# Patient Record
Sex: Male | Born: 1995 | Race: Black or African American | Hispanic: No | Marital: Single | Smoking: Never smoker
Health system: Southern US, Community
[De-identification: ages and names within clinical notes are randomized; demographics above are authoritative.]

---

## 2017-10-01 ENCOUNTER — Other Ambulatory Visit: Payer: Self-pay

## 2017-10-01 ENCOUNTER — Encounter: Payer: Self-pay | Admitting: *Deleted

## 2017-10-01 ENCOUNTER — Emergency Department
Admission: EM | Admit: 2017-10-01 | Discharge: 2017-10-01 | Disposition: A | Discharge status: Home / Self Care | Payer: Medicaid Other | Attending: Emergency Medicine | Admitting: Emergency Medicine

## 2017-10-01 DIAGNOSIS — R112 Nausea with vomiting, unspecified: Secondary | ICD-10-CM | POA: Diagnosis present

## 2017-10-01 DIAGNOSIS — K529 Noninfective gastroenteritis and colitis, unspecified: Secondary | ICD-10-CM | POA: Diagnosis not present

## 2017-10-01 LAB — COMPREHENSIVE METABOLIC PANEL
ALK PHOS: 61 U/L (ref 38–126)
ALT: 26 U/L (ref 17–63)
ANION GAP: 8 (ref 5–15)
AST: 27 U/L (ref 15–41)
Albumin: 4 g/dL (ref 3.5–5.0)
BUN: 15 mg/dL (ref 6–20)
CALCIUM: 9.2 mg/dL (ref 8.9–10.3)
CO2: 23 mmol/L (ref 22–32)
CREATININE: 0.96 mg/dL (ref 0.61–1.24)
Chloride: 106 mmol/L (ref 101–111)
GFR calc non Af Amer: >60 mL/min (ref >60)
Glucose, Bld: 125 mg/dL — ABNORMAL HIGH (ref 65–99)
Potassium: 4 mmol/L (ref 3.5–5.1)
SODIUM: 137 mmol/L (ref 135–145)
Total Bilirubin: 1.2 mg/dL (ref 0.3–1.2)
Total Protein: 7.6 g/dL (ref 6.5–8.1)

## 2017-10-01 LAB — URINALYSIS, COMPLETE (UACMP) WITH MICROSCOPIC
Bacteria, UA: NONE SEEN
Bilirubin Urine: NEGATIVE
GLUCOSE, UA: NEGATIVE mg/dL
HGB URINE DIPSTICK: NEGATIVE
KETONES UR: NEGATIVE mg/dL
Leukocytes, UA: NEGATIVE
Nitrite: NEGATIVE
PH: 7 (ref 5.0–8.0)
PROTEIN: NEGATIVE mg/dL
Specific Gravity, Urine: 1.023 (ref 1.005–1.030)
Squamous Epithelial / LPF: NONE SEEN

## 2017-10-01 LAB — LIPASE, BLOOD: Lipase: 23 U/L (ref 11–51)

## 2017-10-01 LAB — CBC
HCT: 45.1 % (ref 40.0–52.0)
HEMOGLOBIN: 15.5 g/dL (ref 13.0–18.0)
MCH: 29.9 pg (ref 26.0–34.0)
MCHC: 34.4 g/dL (ref 32.0–36.0)
MCV: 86.9 fL (ref 80.0–100.0)
PLATELETS: 282 10*3/uL (ref 150–440)
RBC: 5.19 MIL/uL (ref 4.40–5.90)
RDW: 16.7 % — ABNORMAL HIGH (ref 11.5–14.5)
WBC: 10.2 10*3/uL (ref 3.8–10.6)

## 2017-10-01 MED ORDER — LOPERAMIDE HCL 2 MG PO CAPS
ORAL_CAPSULE | ORAL | Status: AC
  Filled 2017-10-01: qty 2

## 2017-10-01 MED ORDER — ONDANSETRON 4 MG PO TBDP: 4.0000 mg | ORAL_TABLET | Freq: Three times a day (TID) | ORAL | 0 refills | Status: DC | PRN

## 2017-10-01 MED ORDER — ONDANSETRON 4 MG PO TBDP
4.0000 mg | ORAL_TABLET | Freq: Once | ORAL | Status: AC
  Administered 2017-10-01: 4 mg via ORAL

## 2017-10-01 MED ORDER — LOPERAMIDE HCL 2 MG PO TABS: 2.0000 mg | ORAL_TABLET | Freq: Four times a day (QID) | ORAL | 0 refills | Status: AC | PRN

## 2017-10-01 MED ORDER — ONDANSETRON 4 MG PO TBDP
ORAL_TABLET | ORAL | Status: AC
  Filled 2017-10-01: qty 1

## 2017-10-01 MED ORDER — LOPERAMIDE HCL 2 MG PO CAPS
4.0000 mg | ORAL_CAPSULE | Freq: Once | ORAL | Status: AC
  Administered 2017-10-01: 4 mg via ORAL

## 2017-10-01 NOTE — ED Notes (Signed)
Pt ambulatory to POV. VSS. NAD. Discharge instructions RX and follow up reviewed. All questions answered.

## 2017-10-01 NOTE — ED Provider Notes (Signed)
[facility] Emergency Department Provider Note  Time seen: 11:01 PM  I have reviewed the triage vital signs and the nursing notes.   HISTORY  Chief Complaint Abdominal Pain and Back Pain    HPI Jeffrey Moore is a 22 y.o. male with no significant past medical history presents to the emergency department for nausea vomiting diarrhea.  According to the patient since last night he is felt body aches and cramps, has been nauseated and developed watery diarrhea last night which has continued today along with frequent episodes of vomiting.  Patient denies any abdominal "pain."  But does state cramping before he has to have a bowel movement.  States body aches mild headache, denies any cough or congestion.   No past medical history on file.  There are no active problems to display for this patient.     Prior to Admission medications   Not on File    Allergies  Allergen Reactions  . Amoxil [Amoxicillin] Rash    No family history on file.  Social History Social History   Tobacco Use  . Smoking status: Never Smoker  . Smokeless tobacco: Never Used  Substance Use Topics  . Alcohol use: No    Frequency: Never  . Drug use: No    Review of Systems Constitutional: Negative for fever, low-grade fever 99.6 in the emergency department. Eyes: Negative for visual complaints ENT: Negative for recent illness/congestion Cardiovascular: Negative for chest pain. Respiratory: Negative for shortness of breath. Gastrointestinal: Positive for abdominal cramping.  Nausea vomiting diarrhea. Genitourinary: Negative for urinary compaints Musculoskeletal: States some body aches/cramps. Skin: Negative for skin complaints  Neurological: Mild headache All other ROS negative  ____________________________________________   PHYSICAL EXAM:  VITAL SIGNS: ED Triage Vitals  Enc Vitals Group     BP 10/01/17 2001 110/78     Pulse Rate 10/01/17 2001 (!) 115     Resp  10/01/17 2001 18     Temp 10/01/17 2001 99.6 F (37.6 C)     Temp Source 10/01/17 2001 Oral     SpO2 10/01/17 2001 98 %     Weight 10/01/17 2001 275 lb (124.7 kg)     Height 10/01/17 2001 6' (1.829 m)     Head Circumference --      Peak Flow --      Pain Score 10/01/17 2005 6     Pain Loc --      Pain Edu? --      Excl. in GC? --    Constitutional: Alert and oriented. Well appearing and in no distress. Eyes: Normal exam ENT   Head: Normocephalic and atraumatic.   Mouth/Throat: Mucous membranes are moist. Cardiovascular: Normal rate, regular rhythm. No murmur Respiratory: Normal respiratory effort without tachypnea nor retractions. Breath sounds are clear  Gastrointestinal: Soft, largely nontender to palpation.  No rebound or guarding.  Patient does state mild cramping sensations at times. Musculoskeletal: Nontender with normal range of motion in all extremities.  Neurologic:  Normal speech and language. No gross focal neurologic deficits Skin:  Skin is warm, dry and intact.  Psychiatric: Mood and affect are normal.    INITIAL IMPRESSION / ASSESSMENT AND PLAN / ED COURSE  Pertinent labs & imaging results that were available during my care of the patient were reviewed by me and considered in my medical decision making (see chart for details).  She presents the emergency department for nausea vomiting diarrhea beginning last night.  Symptoms are very suggestive of gastroenteritis, differential  would include gastroenteritis, gastritis, enteritis, viral illness, colitis or diverticulitis.  Patient has a nontender abdominal exam, labs are largely within normal limits.  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  Patient does have a low-grade fever 99.6 in the emergency department.  No significant dehydration on blood work.  Will dose Zofran, loperamide.  We will discharge on the same.   I discussed return precautions with the patient as well as supportive care at home, Tylenol, ibuprofen fluids and good handwashing.  ____________________________________________   FINAL CLINICAL IMPRESSION(S) / ED DIAGNOSES  Gastroenteritis    Minna Antis, MD 10/01/17 2304

## 2017-10-01 NOTE — ED Triage Notes (Signed)
Pt reports low back pain and abd pain.  Vomited x 3 .  Diarrhea x 1.  Sx for 1 day.

## 2018-06-07 ENCOUNTER — Other Ambulatory Visit: Payer: Self-pay

## 2018-06-07 ENCOUNTER — Emergency Department
Admission: EM | Admit: 2018-06-07 | Discharge: 2018-06-07 | Disposition: A | Discharge status: Home / Self Care | Payer: No Typology Code available for payment source | Attending: Emergency Medicine | Admitting: Emergency Medicine

## 2018-06-07 ENCOUNTER — Emergency Department: Payer: No Typology Code available for payment source

## 2018-06-07 DIAGNOSIS — K529 Noninfective gastroenteritis and colitis, unspecified: Secondary | ICD-10-CM | POA: Diagnosis not present

## 2018-06-07 DIAGNOSIS — R1013 Epigastric pain: Secondary | ICD-10-CM | POA: Diagnosis present

## 2018-06-07 LAB — COMPREHENSIVE METABOLIC PANEL
ALT: 24 U/L (ref 0–44)
AST: 28 U/L (ref 15–41)
Albumin: 4.1 g/dL (ref 3.5–5.0)
Alkaline Phosphatase: 58 U/L (ref 38–126)
Anion gap: 5 (ref 5–15)
BUN: 15 mg/dL (ref 6–20)
CALCIUM: 9.2 mg/dL (ref 8.9–10.3)
CHLORIDE: 112 mmol/L — AB (ref 98–111)
CO2: 24 mmol/L (ref 22–32)
CREATININE: 0.88 mg/dL (ref 0.61–1.24)
Glucose, Bld: 105 mg/dL — ABNORMAL HIGH (ref 70–99)
Potassium: 3.7 mmol/L (ref 3.5–5.1)
SODIUM: 141 mmol/L (ref 135–145)
Total Bilirubin: 1.1 mg/dL (ref 0.3–1.2)
Total Protein: 6.9 g/dL (ref 6.5–8.1)

## 2018-06-07 LAB — CBC
HEMATOCRIT: 41.2 % (ref 39.0–52.0)
Hemoglobin: 14.8 g/dL (ref 13.0–17.0)
MCH: 30.7 pg (ref 26.0–34.0)
MCHC: 35.9 g/dL (ref 30.0–36.0)
MCV: 85.5 fL (ref 80.0–100.0)
NRBC: 0 % (ref 0.0–0.2)
PLATELETS: 307 10*3/uL (ref 150–400)
RBC: 4.82 MIL/uL (ref 4.22–5.81)
RDW: 15.9 % — AB (ref 11.5–15.5)
WBC: 18.4 10*3/uL — AB (ref 4.0–10.5)

## 2018-06-07 LAB — INFLUENZA PANEL BY PCR (TYPE A & B)
INFLAPCR: NEGATIVE
INFLBPCR: NEGATIVE

## 2018-06-07 LAB — LIPASE, BLOOD: LIPASE: 28 U/L (ref 11–51)

## 2018-06-07 MED ORDER — ONDANSETRON HCL 4 MG/2ML IJ SOLN
4.0000 mg | Freq: Once | INTRAMUSCULAR | Status: AC
  Administered 2018-06-07: 4 mg via INTRAVENOUS
  Filled 2018-06-07: qty 2

## 2018-06-07 MED ORDER — SODIUM CHLORIDE 0.9 % IV BOLUS: 1000.0000 mL | Freq: Once | INTRAVENOUS | Status: DC

## 2018-06-07 MED ORDER — OXYCODONE HCL 5 MG PO TABS: 5.0000 mg | ORAL_TABLET | ORAL | 0 refills | Status: AC | PRN

## 2018-06-07 MED ORDER — FAMOTIDINE IN NACL 20-0.9 MG/50ML-% IV SOLN
20.0000 mg | Freq: Once | INTRAVENOUS | Status: AC
  Administered 2018-06-07: 20 mg via INTRAVENOUS
  Filled 2018-06-07: qty 50

## 2018-06-07 MED ORDER — ONDANSETRON 4 MG PO TBDP: 4.0000 mg | ORAL_TABLET | Freq: Three times a day (TID) | ORAL | 0 refills | Status: AC | PRN

## 2018-06-07 MED ORDER — HYDROMORPHONE HCL 1 MG/ML IJ SOLN
0.5000 mg | Freq: Once | INTRAMUSCULAR | Status: AC
  Administered 2018-06-07: 0.5 mg via INTRAVENOUS
  Filled 2018-06-07: qty 1

## 2018-06-07 MED ORDER — IOPAMIDOL (ISOVUE-300) INJECTION 61%
125.0000 mL | Freq: Once | INTRAVENOUS | Status: AC | PRN
  Administered 2018-06-07: 125 mL via INTRAVENOUS

## 2018-06-07 NOTE — ED Provider Notes (Signed)
[facility] Emergency Department Provider Note  ____________________________________________  Time seen: Approximately 3:33 PM  I have reviewed the triage vital signs and the nursing notes.   HISTORY  Chief Complaint Abdominal Pain    HPI Jeffrey Moore is a 22 y.o. male with morbid obesity, no history of abdominal surgery, presenting for epigastric pain, nausea and vomiting.  The patient reports that he was standing at the bank at 12 noon today when he had the acute onset of a severe epigastric pain with multiple episodes of nausea and vomiting, now unable to drink any fluids.  He had a normal bowel movement this morning, but since then has not had any additional bowel movements or passed gas.  He denies any diarrhea, dysuria, fevers or shaking chills.  He has not tried anything for his symptoms.  The patient has no sick contacts.  He has congestion with clear rhinorrhea but no cough; has not had a flu shot this year.  History reviewed. No pertinent past medical history.  There are no active problems to display for this patient.   History reviewed. No pertinent surgical history.  Current Outpatient Rx  . Order #: [id number] Class: Print  . Order #: [id number] Class: Print  . Order #: [id number] Class: Print    Allergies Amoxil [amoxicillin]  No family history on file.  Social History Social History   Tobacco Use  . Smoking status: Never Smoker  . Smokeless tobacco: Never Used  Substance Use Topics  . Alcohol use: No    Frequency: Never  . Drug use: No    Review of Systems Constitutional: No fever/chills.  No lightheadedness or syncope. Eyes: No visual changes. ENT: No sore throat. No congestion or rhinorrhea. Cardiovascular: Denies chest pain. Denies palpitations. Respiratory: Denies shortness of breath.  No cough. Gastrointestinal: Positive epigastric abdominal pain.  _+nausea, +vomiting.  No diarrhea.  No constipation.  Does not pass gas this  afternoon. Genitourinary: Negative for dysuria.  No urinary frequency. Musculoskeletal: Negative for back pain. Skin: Negative for rash. Neurological: Negative for headaches. No focal numbness, tingling or weakness.     ____________________________________________   PHYSICAL EXAM:  VITAL SIGNS: ED Triage Vitals [06/07/18 1434]  Enc Vitals Group     BP 126/69     Pulse Rate 85     Resp 17     Temp 98.1 F (36.7 C)     Temp Source Oral     SpO2 100 %     Weight (!) 320 lb (145.2 kg)     Height 6\' 1"  (1.854 m)     Head Circumference      Peak Flow      Pain Score 9     Pain Loc      Pain Edu?      Excl. in GC?     Constitutional: Alert and oriented. Answers questions appropriately.  Uncomfortable appearing. Eyes: Conjunctivae are normal.  EOMI. No scleral icterus. Head: Atraumatic. Nose: No congestion/rhinnorhea. Mouth/Throat: Mucous membranes are moist.  Neck: No stridor.  Supple.  No JVD.  No meningismus. Cardiovascular: Normal rate, regular rhythm. No murmurs, rubs or gallops.  Respiratory: Normal respiratory effort.  No accessory muscle use or retractions. Lungs CTAB.  No wheezes, rales or ronchi. Gastrointestinal: Morbidly obese.  Soft, and nondistended.  Tender to palpation over the epigastrium.  Negative Murphy sign.  No guarding or rebound.  No peritoneal signs. Musculoskeletal: No LE edema.  Neurologic:  A&Ox3.  Speech is clear.  Face and smile are  symmetric.  EOMI.  Moves all extremities well. Skin:  Skin is warm, dry and intact. No rash noted. Psychiatric: Mood and affect are normal. Speech and behavior are normal.  Normal judgement.  ____________________________________________   LABS (all labs ordered are listed, but only abnormal results are displayed)  Labs Reviewed  COMPREHENSIVE METABOLIC PANEL - Abnormal; Notable for the following components:      Result Value   Chloride 112 (*)    Glucose, Bld 105 (*)    All other components within normal  limits  CBC - Abnormal; Notable for the following components:   WBC 18.4 (*)    RDW 15.9 (*)    All other components within normal limits  LIPASE, BLOOD  INFLUENZA PANEL BY PCR (TYPE A & B)  URINALYSIS, COMPLETE (UACMP) WITH MICROSCOPIC   ____________________________________________  EKG  ED ECG REPORT I, Anne-Caroline Sharma Covert, the attending physician, personally viewed and interpreted this ECG.   Date: 06/07/2018  EKG Time: 1541  Rate: 67  Rhythm: normal sinus rhythm  Axis: normal  Intervals:none  ST&T Change: No STEMI  ____________________________________________  RADIOLOGY  Ct Abdomen Pelvis W Contrast  Result Date: 06/07/2018 CLINICAL DATA:  Onset epigastric pain with nausea and vomiting this morning. EXAM: CT ABDOMEN AND PELVIS WITH CONTRAST TECHNIQUE: Multidetector CT imaging of the abdomen and pelvis was performed using the standard protocol following bolus administration of intravenous contrast. CONTRAST:  125 mL ISOVUE-300 IOPAMIDOL (ISOVUE-300) INJECTION 61% COMPARISON:  None. FINDINGS: Lower chest: The lung bases are clear. No pleural or pericardial effusion. Heart size is normal. Hepatobiliary: No focal liver abnormality is seen. No gallstones, gallbladder wall thickening, or biliary dilatation. Pancreas: Unremarkable. No pancreatic ductal dilatation or surrounding inflammatory changes. Spleen: Normal in size without focal abnormality. Adrenals/Urinary Tract: Adrenal glands are unremarkable. Kidneys are normal, without renal calculi, focal lesion, or hydronephrosis. Bladder is unremarkable. Stomach/Bowel: There is mild prominence without frank dilatation of scattered loops of fluid-filled small bowel. The small bowel otherwise appears normal. The stomach, colon and appendix appear normal. Vascular/Lymphatic: No significant vascular findings are present. No enlarged abdominal or pelvic lymph nodes. Reproductive: Prostate is unremarkable. Other: There is mild haziness of fat  in the root of the mesentery with a few small lymph nodes identified but no pathologically enlarged lymph nodes. Musculoskeletal: Negative. IMPRESSION: Mild haziness of fat in the root of the mesentery may be due to inflammatory change. Mild prominence of scattered fluid-filled small bowel loops without dilatation may be due to enteritis. There is no bowel obstruction or evidence of bowel ischemia. Electronically Signed   By: Drusilla Kanner M.D.   On: 06/07/2018 16:16    ____________________________________________   PROCEDURES  Procedure(s) performed: None  Procedures  Critical Care performed: No ____________________________________________   INITIAL IMPRESSION / ASSESSMENT AND PLAN / ED COURSE  Pertinent labs & imaging results that were available during my care of the patient were reviewed by me and considered in my medical decision making (see chart for details).  22 y.o. with morbid obesity, native abdomen, presenting with epigastric pain, nausea and vomiting.  Overall, the patient is hemodynamically stable.  However, he does have an elevated white blood cell count to 18.5 and his exam is limited due to his morbid obesity.  I do suspect either a viral or foodborne GI illness, or reflux, but cannot rule out gallbladder disease, colitis, or other infectious etiology.  We will get a CT scan for further evaluation.  In the meantime, the patient will receive intravenous fluids  with symptomatic control.  A screening EKG will also be performed.  Plan reevaluation for final disposition.  ----------------------------------------- 5:10 PM on 06/07/2018 -----------------------------------------  Patient's work-up in the emergency department has been reassuring.  His CT scan does show some changes that are consistent with enteritis, which does match his clinical history.  There is no evidence of a severe infection, or surgical pathology today.  The patient is feeling significantly better, and is  able to tolerate liquids without difficulty.  At this time, we will plan to discharge the patient home with expectant management, as well as symptomatic treatment.  Follow-up instructions as well as return precautions were discussed.  ____________________________________________  FINAL CLINICAL IMPRESSION(S) / ED DIAGNOSES  Final diagnoses:  Enteritis         NEW MEDICATIONS STARTED DURING THIS VISIT:  New Prescriptions   ONDANSETRON (ZOFRAN ODT) 4 MG DISINTEGRATING TABLET    Take 1 tablet (4 mg total) by mouth every 8 (eight) hours as needed for nausea or vomiting.   OXYCODONE (ROXICODONE) 5 MG IMMEDIATE RELEASE TABLET    Take 1 tablet (5 mg total) by mouth every 4 (four) hours as needed for moderate pain or severe pain.      Rockne Menghini, MD 06/07/18 1714

## 2018-06-07 NOTE — Discharge Instructions (Addendum)
You may take Tylenol for mild to moderate pain.  Oxycodone is for severe pain, and you may not drive within 8 hours of taking oxycodone.  Zofran is for nausea and vomiting.  Patient plenty of fluid to stay well-hydrated.  Take a clear liquid diet for the next 48 hours, then advance to a bland diet as tolerated.  Make an appointment to see your primary care physician in 3 to 4 days for reevaluation.  These practice frequent and good handwashing to prevent the spread of infection, in case your illness today is contagious.  Return to the emergency department if you develop severe pain, lightheadedness or fainting, fever, or inability to keep down fluids, or any other symptoms concerning to you.

## 2018-06-07 NOTE — ED Triage Notes (Signed)
Pt c/o epigastric pain with N/V since 9am this morning. Denies diarrhea.

## 2018-06-10 ENCOUNTER — Other Ambulatory Visit: Payer: Self-pay

## 2018-06-10 ENCOUNTER — Emergency Department: Payer: No Typology Code available for payment source

## 2018-06-10 ENCOUNTER — Emergency Department
Admission: EM | Admit: 2018-06-10 | Discharge: 2018-06-10 | Disposition: A | Discharge status: Home / Self Care | Payer: No Typology Code available for payment source | Attending: Emergency Medicine | Admitting: Emergency Medicine

## 2018-06-10 ENCOUNTER — Encounter: Payer: Self-pay | Admitting: Emergency Medicine

## 2018-06-10 DIAGNOSIS — R0602 Shortness of breath: Secondary | ICD-10-CM | POA: Diagnosis present

## 2018-06-10 DIAGNOSIS — J40 Bronchitis, not specified as acute or chronic: Secondary | ICD-10-CM | POA: Diagnosis not present

## 2018-06-10 HISTORY — DX: Morbid (severe) obesity due to excess calories: E66.01

## 2018-06-10 MED ORDER — BENZONATATE 100 MG PO CAPS: 100.0000 mg | ORAL_CAPSULE | Freq: Three times a day (TID) | ORAL | 0 refills | Status: AC | PRN

## 2018-06-10 NOTE — ED Triage Notes (Signed)
Patient reports feeling short of breath today, worse when laying down.  Patient is ambulatory to triage room with difficulty or distress.  Patient is able to speak in complete sentences.

## 2018-06-10 NOTE — ED Notes (Signed)
No peripheral IV placed this visit.   Discharge instructions reviewed with patient. Questions fielded by this RN. Patient verbalizes understanding of instructions. Patient discharged home in stable condition per forbach. No acute distress noted at time of discharge.   

## 2018-06-10 NOTE — ED Provider Notes (Signed)
[facility] Emergency Department Provider Note  ____________________________________________   First MD Initiated Contact with Patient 06/10/18 [phone]     (approximate)  I have reviewed the triage vital signs and the nursing notes.   HISTORY  Chief Complaint Shortness of Breath    HPI Jeffrey Moore is a 22 y.o. male With no significant chronic medical history who presents for evaluation of shortness of breath with exertion.  He was seen in the emergency department 2 to 3 days ago for evaluation of nausea, vomiting, and diarrhea.  He had an extensive work-up that was generally reassuring and was discharged with presumed viral illness.  He is feeling much better from that but has noticed over the last couple of days that he has been increasingly short of breath.  Exertion makes it worse and rest makes it better.  Lying down flat also seems to make his shortness of breath worse.  He has some pain when he coughs but it is in his ribs and he also wonders if it could be from his recent vomiting.  He states the symptoms are somewhere between moderate and severe.  He denies recent fever/chills, chest pain, nausea, vomiting, and abdominal pain, and says that the symptoms he was having a few days ago has basically completely resolved.  He has no history of asthma but he did have bronchitis as a child that was reportedly quite severe.  He does not smoke cigarettes.  He is able to ambulate without difficulty.    Past Medical History:  Diagnosis Date  . Morbid obesity (HCC)     There are no active problems to display for this patient.   History reviewed. No pertinent surgical history.  Prior to Admission medications   Medication Sig Start Date End Date Taking? Authorizing Provider  benzonatate (TESSALON PERLES) 100 MG capsule Take 1 capsule (100 mg total) by mouth 3 (three) times daily as needed for cough. 06/10/18   Loleta Rose, MD  loperamide (IMODIUM A-D) 2 MG  tablet Take 1 tablet (2 mg total) by mouth 4 (four) times daily as needed for diarrhea or loose stools. 10/01/17   Minna Antis, MD  ondansetron (ZOFRAN ODT) 4 MG disintegrating tablet Take 1 tablet (4 mg total) by mouth every 8 (eight) hours as needed for nausea or vomiting. 06/07/18   Rockne Menghini, MD  oxyCODONE (ROXICODONE) 5 MG immediate release tablet Take 1 tablet (5 mg total) by mouth every 4 (four) hours as needed for moderate pain or severe pain. 06/07/18 06/07/19  Rockne Menghini, MD    Allergies Amoxil [amoxicillin]  History reviewed. No pertinent family history.  Social History Social History   Tobacco Use  . Smoking status: Never Smoker  . Smokeless tobacco: Never Used  Substance Use Topics  . Alcohol use: No    Frequency: Never  . Drug use: Yes    Types: Marijuana    Review of Systems Constitutional: No fever/chills Eyes: No visual changes. ENT: No sore throat. Cardiovascular: Denies chest pain. Respiratory: cough, SOB with exertion and lying flat Gastrointestinal: No abdominal pain.  No nausea, no vomiting.  No diarrhea.  No constipation. Had N/V/D several days ago, now resolved Genitourinary: Negative for dysuria. Musculoskeletal: Negative for neck pain.  Negative for back pain. Integumentary: Negative for rash. Neurological: Negative for headaches, focal weakness or numbness.   ____________________________________________   PHYSICAL EXAM:  VITAL SIGNS: ED Triage Vitals [06/10/18 0020]  Enc Vitals Group     BP (!) 149/75  Pulse Rate 66     Resp 18     Temp 98.2 F (36.8 C)     Temp Source Oral     SpO2 97 %     Weight      Height      Head Circumference      Peak Flow      Pain Score 0     Pain Loc      Pain Edu?      Excl. in GC?     Constitutional: Alert and oriented. Well appearing and in no acute distress. Eyes: Conjunctivae are normal.  Head: Atraumatic. Nose: No congestion/rhinnorhea. Mouth/Throat: Mucous  membranes are moist. Neck: No stridor.  No meningeal signs.   Cardiovascular: Normal rate, regular rhythm. Good peripheral circulation. Grossly normal heart sounds. Respiratory: Normal respiratory effort.  No retractions. Lungs CTAB. Gastrointestinal: Morbid obesity. Soft and nontender. No distention.  Musculoskeletal: No lower extremity tenderness nor edema. No gross deformities of extremities. Neurologic:  Normal speech and language. No gross focal neurologic deficits are appreciated.  Skin:  Skin is warm, dry and intact. No rash noted. Psychiatric: Mood and affect are normal. Speech and behavior are normal.  ____________________________________________   LABS (all labs ordered are listed, but only abnormal results are displayed)  Labs Reviewed - No data to display ____________________________________________  EKG  None - EKG not ordered by ED physician ____________________________________________  RADIOLOGY I, Loleta Rose, personally viewed and evaluated these images (plain radiographs) as part of my medical decision making, as well as reviewing the written report by the radiologist.  ED MD interpretation: Pattern consistent with bronchitis but no evidence of pneumonia, pneumothorax, or other acute/emergent thoracic abnormality  Official radiology report(s): Dg Chest 2 View  Result Date: 06/10/2018 CLINICAL DATA:  Shortness of breath today. EXAM: CHEST - 2 VIEW COMPARISON:  10/19/2015 FINDINGS: Normal heart size and pulmonary vascularity. Suggestion of central bronchiectasis with bronchial wall thickening, possibly indicating acute or chronic bronchitis. No airspace disease or consolidation in the lungs. No blunting of costophrenic angles. No pneumothorax. Mediastinal contours appear intact. IMPRESSION: Central bronchiectasis with bronchial wall thickening suggesting acute or chronic bronchitis. Electronically Signed   By: Burman Nieves M.D.   On: 06/10/2018 01:00     ____________________________________________   PROCEDURES  Critical Care performed: No   Procedure(s) performed:   Procedures   ____________________________________________   INITIAL IMPRESSION / ASSESSMENT AND PLAN / ED COURSE  As part of my medical decision making, I reviewed the following data within the electronic MEDICAL RECORD NUMBER Nursing notes reviewed and incorporated, Labs reviewed , Old chart reviewed, Radiograph reviewed  and Notes from prior ED visits    Differential diagnosis includes, but is not limited to, deconditioning in the setting of recent viral illness, bronchitis, pneumonia, asthma, COPD, pneumothorax, less likely CHF and PE.  The patient is well-appearing in no acute distress.  He is young with no chronic medical issues other than morbid obesity.  I reviewed his prior ED visit and saw the extensive work-up including his lab work which were notable for a leukocytosis of 18 but his work-up in general was reassuring and was diagnosed with enteritis.  Those symptoms have almost completely resolved and he feels better except for the shortness of breath and cough.  His chest x-ray is consistent with a bronchitic pattern.  His vital signs are normal, he is afebrile, no tachycardia, no hypoxemia.  He is moving good air and has no wheezing and had no cough during  the time I was interviewing and examining him.  I believe he is likely worn out and deconditioned from being ill and his body habitus is contributing to his shortness of breath.  I explained that there is no evidence of any acute or emergent condition and I do not have any medicines specifically that will help with this sort of issue, but I am prescribing him Tessalon in case he continues to have a cough.  There is no role for antibiotics at this time.  He states that he understands.  I do not think that repeating any lab work tonight will be helpful given that he just had a recent work-up and he has no other  signs and symptoms of worsening infection.  He is ambulatory without difficulty and without becoming dyspneic and I believe he is appropriate for discharge and outpatient follow-up.  I gave my usual and customary return precautions.    ____________________________________________  FINAL CLINICAL IMPRESSION(S) / ED DIAGNOSES  Final diagnoses:  Bronchitis     MEDICATIONS GIVEN DURING THIS VISIT:  Medications - No data to display   ED Discharge Orders         Ordered    benzonatate (TESSALON PERLES) 100 MG capsule  3 times daily PRN     06/10/18 0325           Note:  This document was prepared using Dragon voice recognition software and may include unintentional dictation errors.    Loleta Rose, MD 06/10/18 [phone]

## 2018-06-10 NOTE — ED Notes (Signed)
Pt reports SOB worse with exertion, works in Design[employer], fashion/clothing - doesn't wear mask, hx of PNX and bronchitis and "bad allergies" as a adolescent, pt reports pain when deep breathing and when lying back, denies cough

## 2018-06-10 NOTE — Discharge Instructions (Addendum)

## 2019-12-19 IMAGING — CT CT ABD-PELV W/ CM
2 of 4 series · 16 of 46 positions shown, 18 images · IV contrast (APPLIED)
Comparison: None.

CLINICAL DATA: Onset epigastric pain with nausea and vomiting this
morning.

EXAM:
CT ABDOMEN AND PELVIS WITH CONTRAST
TECHNIQUE: Multidetector CT imaging of the abdomen and pelvis was performed
using the standard protocol following bolus administration of
intravenous contrast.
CONTRAST:  125 mL [id number] IOPAMIDOL ([id number]) INJECTION 61%

[Series 2: routine abd/pel with · axial · 0.79mm/px · z∈[-554,-34]mm · 13 of 114 slices shown, 15 images]
[im 5/114  soft-tissue]
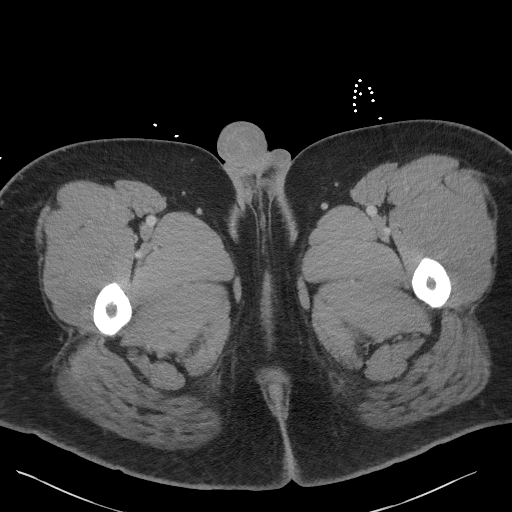
[im 5/114  bone]
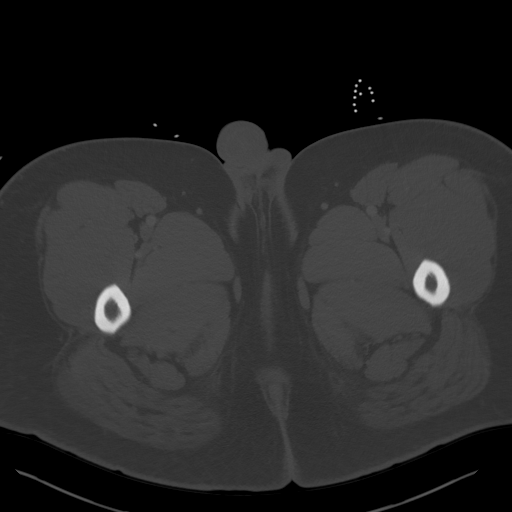
[im 14/114  soft-tissue]
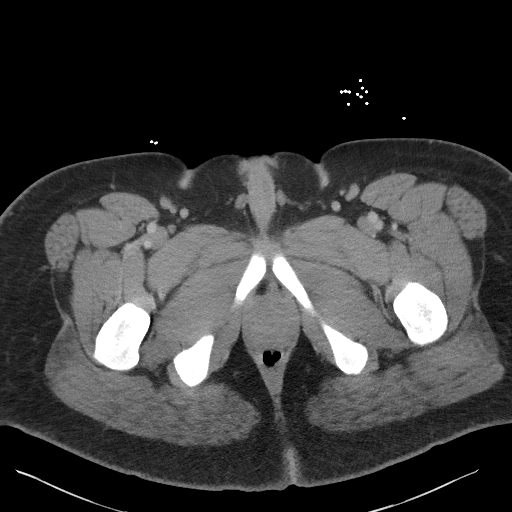
[im 23/114  soft-tissue]
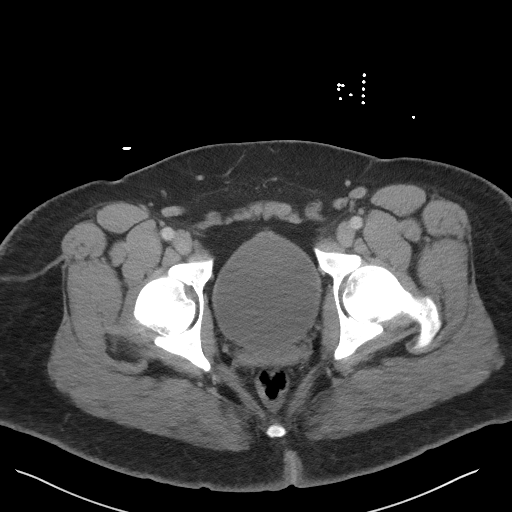
[im 32/114  soft-tissue]
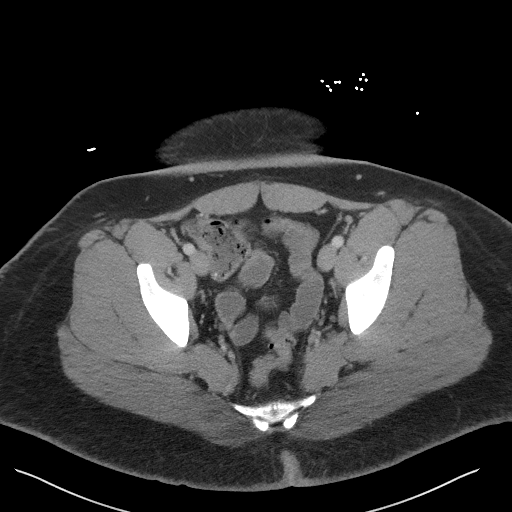
[im 41/114  soft-tissue]
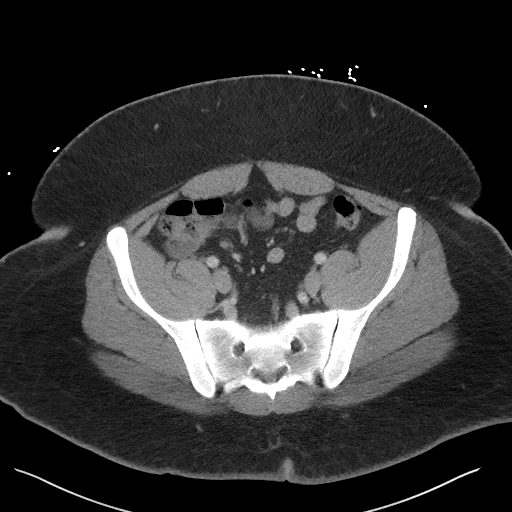
[im 50/114  soft-tissue]
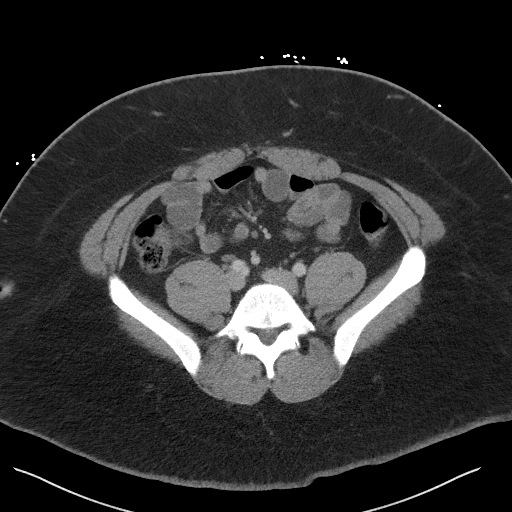
[im 59/114  soft-tissue]
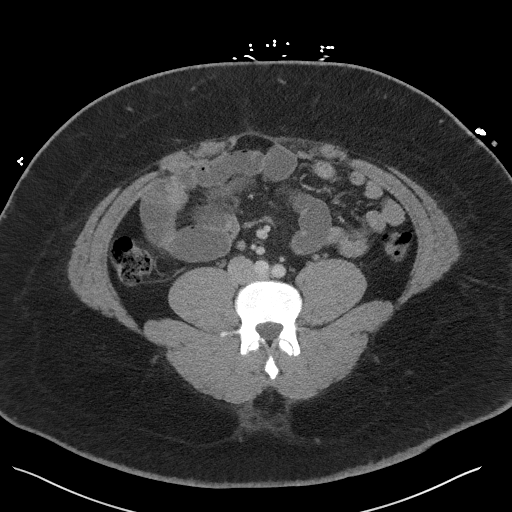
[im 64/114  soft-tissue]
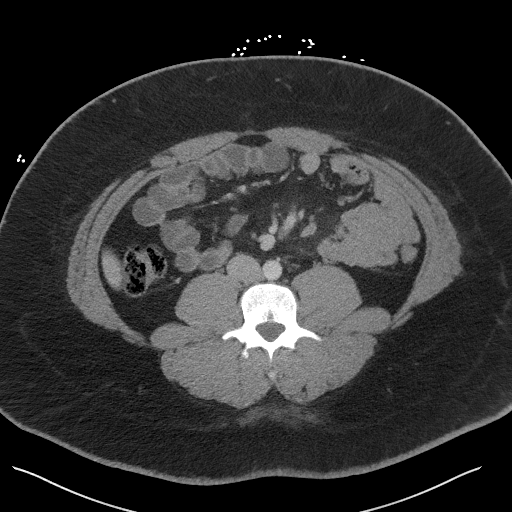
[im 73/114  soft-tissue]
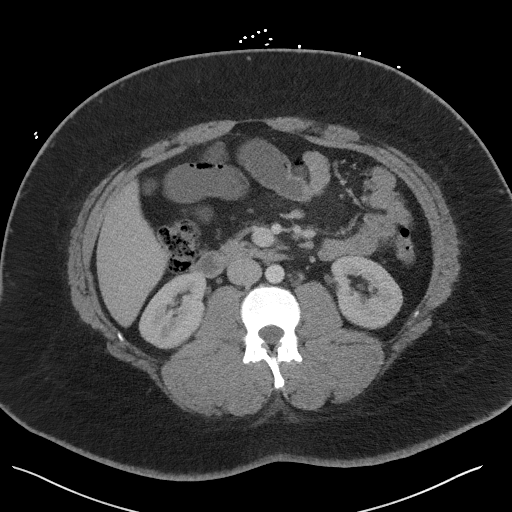
[im 73/114  bone]
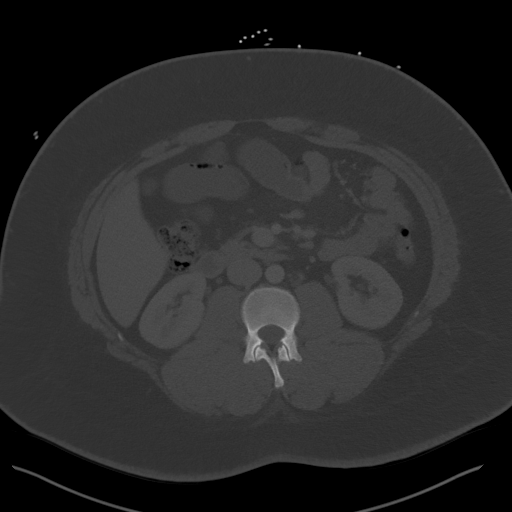
[im 82/114  soft-tissue]
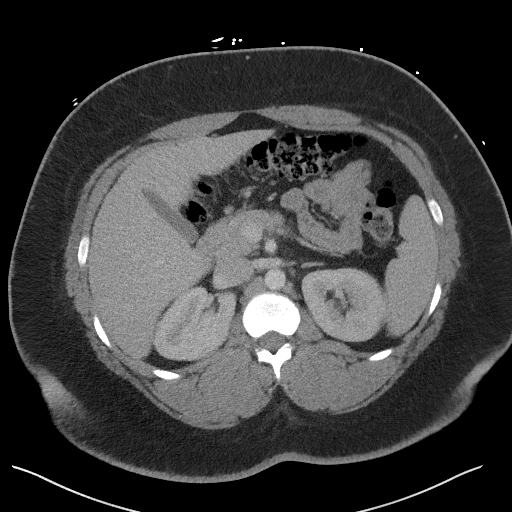
[im 91/114  soft-tissue]
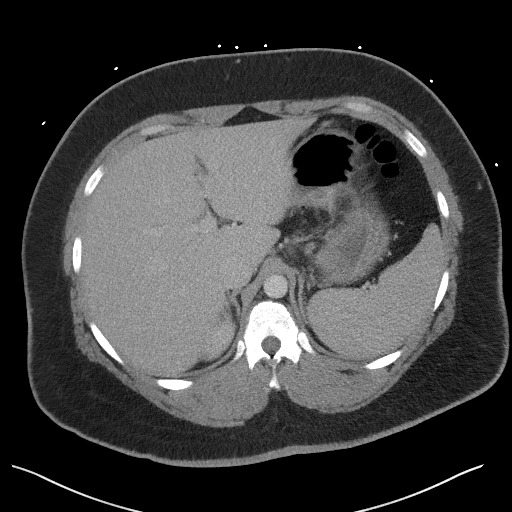
[im 100/114  soft-tissue]
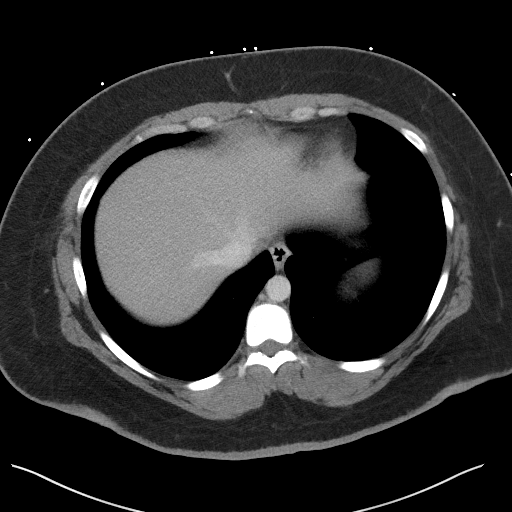
[im 109/114  soft-tissue]
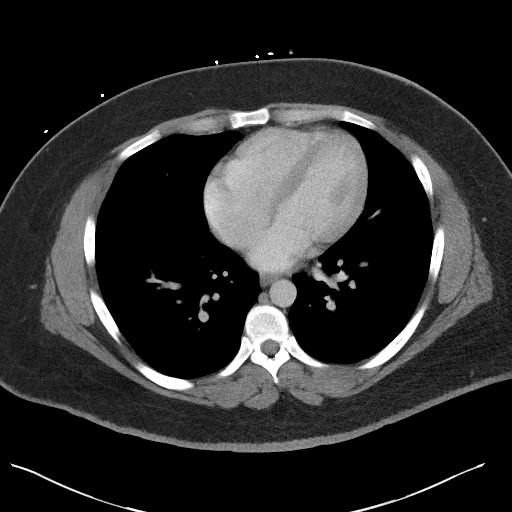

[Series 5: coronal st · coronal · 0.89mm/px · 3 of 96 slices shown]
[im 32/96  soft-tissue]
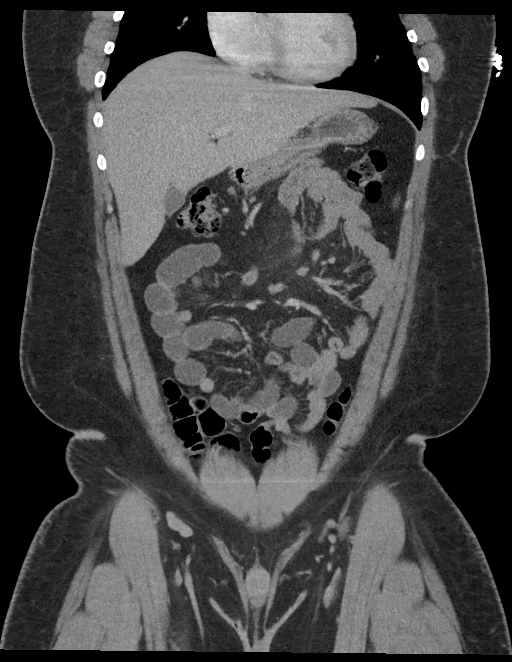
[im 43/96  soft-tissue]
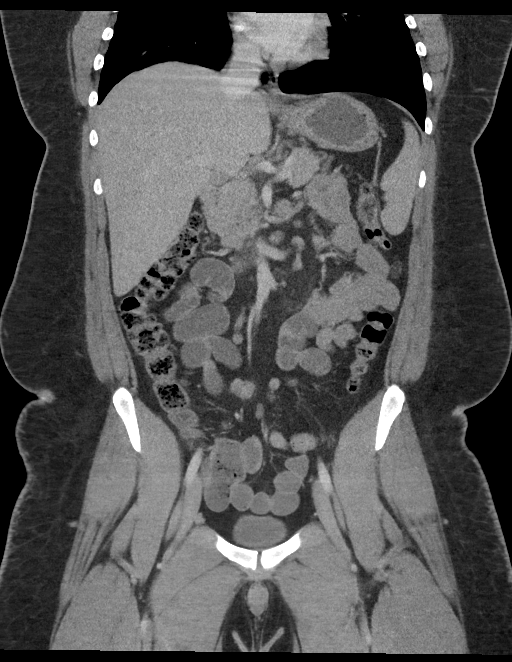
[im 53/96  soft-tissue]
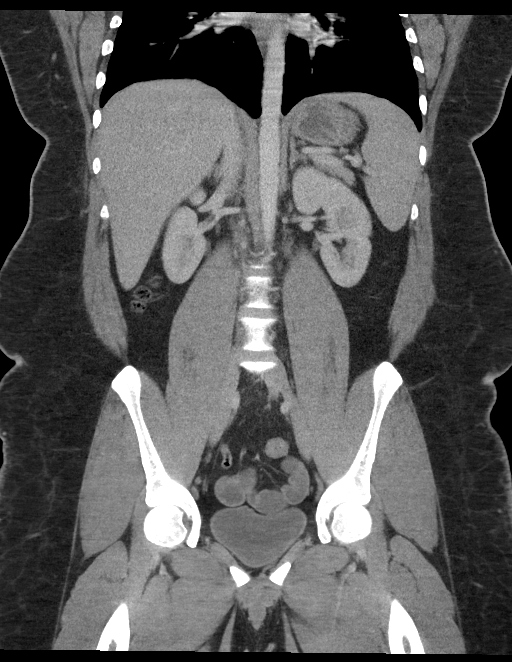

[16 of 46 positions shown; findings below may reference images not displayed]

FINDINGS: Lower chest: The lung bases are clear. No pleural or pericardial
effusion. Heart size is normal.

Hepatobiliary: No focal liver abnormality is seen. No gallstones,
gallbladder wall thickening, or biliary dilatation.

Pancreas: Unremarkable. No pancreatic ductal dilatation or
surrounding inflammatory changes.

Spleen: Normal in size without focal abnormality.

Adrenals/Urinary Tract: Adrenal glands are unremarkable. Kidneys are
normal, without renal calculi, focal lesion, or hydronephrosis.
Bladder is unremarkable.

Stomach/Bowel: There is mild prominence without frank dilatation of
scattered loops of fluid-filled small bowel. The small bowel
otherwise appears normal. The stomach, colon and appendix appear
normal.

Vascular/Lymphatic: No significant vascular findings are present. No
enlarged abdominal or pelvic lymph nodes.

Reproductive: Prostate is unremarkable.

Other: There is mild haziness of fat in the root of the mesentery
with a few small lymph nodes identified but no pathologically
enlarged lymph nodes.

Musculoskeletal: Negative.
IMPRESSION: Mild haziness of fat in the root of the mesentery may be due to
inflammatory change.

Mild prominence of scattered fluid-filled small bowel loops without
dilatation may be due to enteritis. There is no bowel obstruction or
evidence of bowel ischemia.

## 2019-12-22 IMAGING — CR DG CHEST 2V
2 series · 2 of 2 positions shown · non-contrast
Comparison: 10/19/2015

CLINICAL DATA: Shortness of breath today.

EXAM:
CHEST - 2 VIEW

[chest pa]
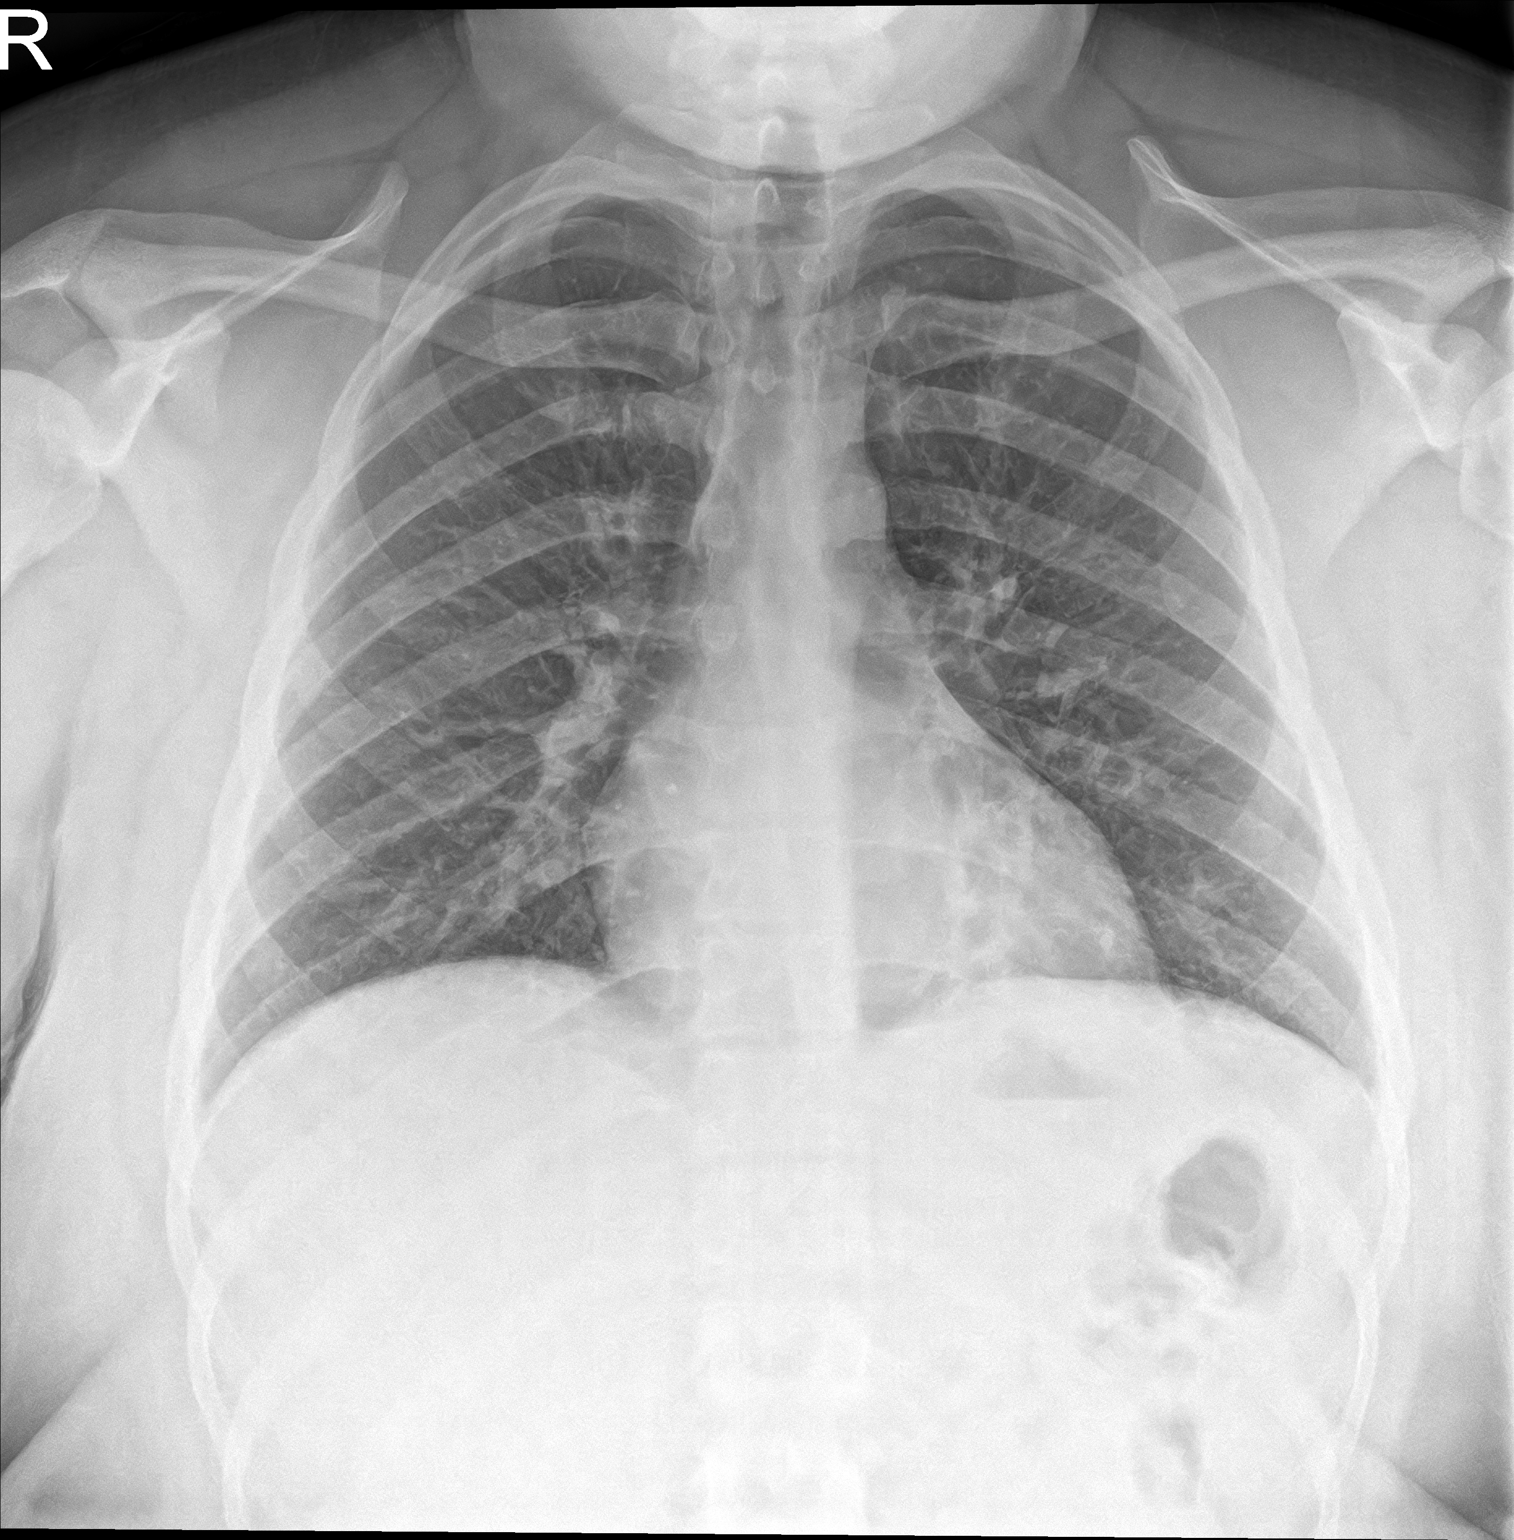

[chest lat]
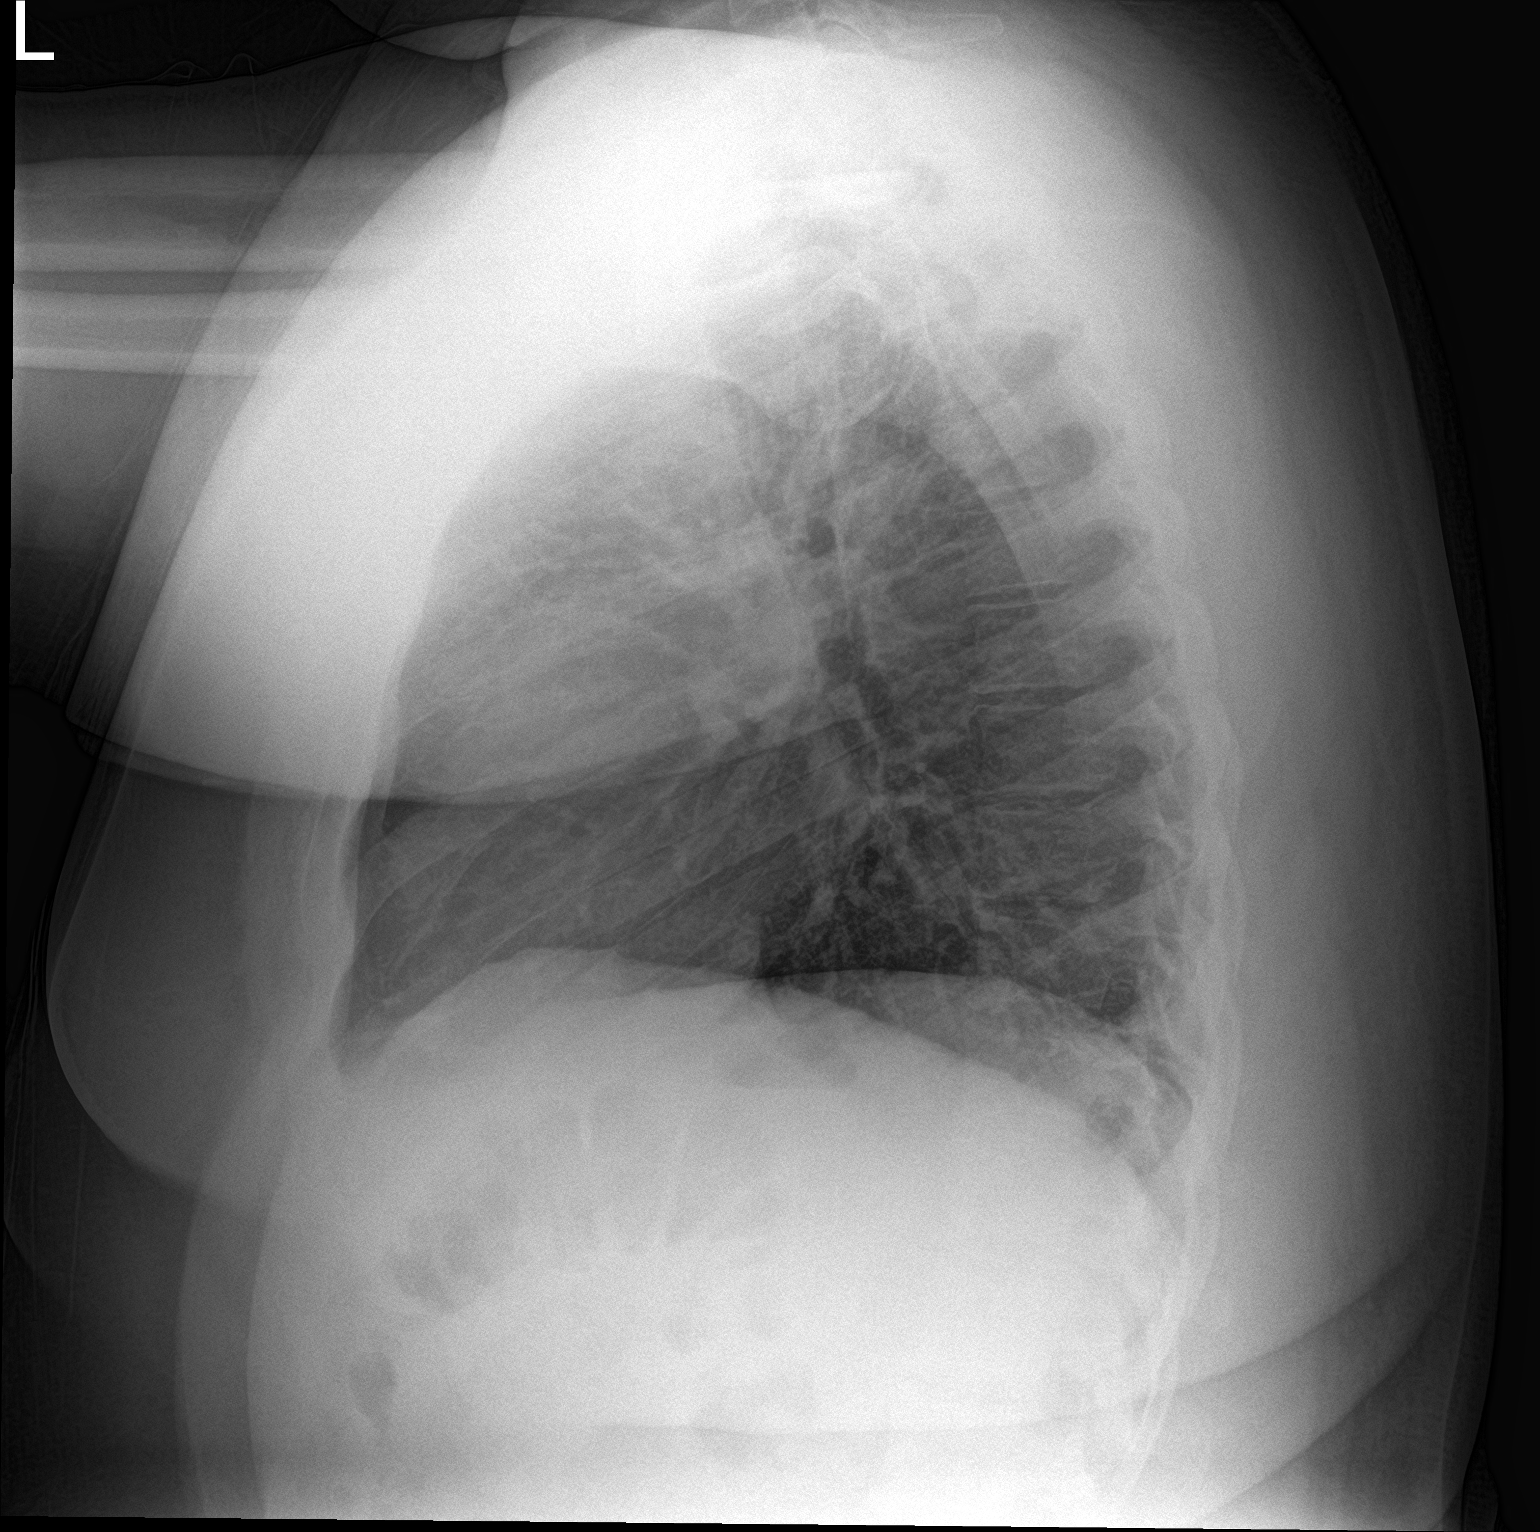

[2 of 2 positions shown; findings below may reference images not displayed]

FINDINGS: Normal heart size and pulmonary vascularity. Suggestion of central
bronchiectasis with bronchial wall thickening, possibly indicating
acute or chronic bronchitis. No airspace disease or consolidation in
the lungs. No blunting of costophrenic angles. No pneumothorax.
Mediastinal contours appear intact.
IMPRESSION: Central bronchiectasis with bronchial wall thickening suggesting
acute or chronic bronchitis.
